# Patient Record
Sex: Female | Born: 1940 | Race: White | Hispanic: No | Smoking: Never smoker
Health system: Southern US, Community
[De-identification: ages and names within clinical notes are randomized; demographics above are authoritative.]

## PROBLEM LIST (undated history)

## (undated) DIAGNOSIS — F419 Anxiety disorder, unspecified: Secondary | ICD-10-CM

## (undated) DIAGNOSIS — E039 Hypothyroidism, unspecified: Secondary | ICD-10-CM

## (undated) DIAGNOSIS — R002 Palpitations: Secondary | ICD-10-CM

## (undated) HISTORY — PX: BACK SURGERY: SHX140

## (undated) HISTORY — PX: HYSTERECTOMY ABDOMINAL WITH SALPINGECTOMY: SHX6725

---

## 2018-09-15 ENCOUNTER — Other Ambulatory Visit: Payer: Self-pay

## 2018-09-15 ENCOUNTER — Encounter: Payer: Self-pay | Admitting: *Deleted

## 2018-09-15 DIAGNOSIS — Z79899 Other long term (current) drug therapy: Secondary | ICD-10-CM | POA: Insufficient documentation

## 2018-09-15 DIAGNOSIS — F419 Anxiety disorder, unspecified: Secondary | ICD-10-CM | POA: Insufficient documentation

## 2018-09-15 DIAGNOSIS — E039 Hypothyroidism, unspecified: Secondary | ICD-10-CM | POA: Insufficient documentation

## 2018-09-15 DIAGNOSIS — R002 Palpitations: Secondary | ICD-10-CM | POA: Diagnosis present

## 2018-09-15 DIAGNOSIS — F329 Major depressive disorder, single episode, unspecified: Secondary | ICD-10-CM | POA: Diagnosis not present

## 2018-09-15 DIAGNOSIS — I10 Essential (primary) hypertension: Secondary | ICD-10-CM | POA: Diagnosis not present

## 2018-09-15 DIAGNOSIS — Z79890 Hormone replacement therapy (postmenopausal): Secondary | ICD-10-CM | POA: Insufficient documentation

## 2018-09-15 DIAGNOSIS — K219 Gastro-esophageal reflux disease without esophagitis: Secondary | ICD-10-CM | POA: Insufficient documentation

## 2018-09-15 DIAGNOSIS — R0789 Other chest pain: Secondary | ICD-10-CM | POA: Diagnosis not present

## 2018-09-15 DIAGNOSIS — Z1159 Encounter for screening for other viral diseases: Secondary | ICD-10-CM | POA: Insufficient documentation

## 2018-09-15 DIAGNOSIS — R079 Chest pain, unspecified: Secondary | ICD-10-CM | POA: Diagnosis present

## 2018-09-15 LAB — BASIC METABOLIC PANEL
Anion gap: 8 (ref 5–15)
BUN: 18 mg/dL (ref 8–23)
CO2: 25 mmol/L (ref 22–32)
Calcium: 9.6 mg/dL (ref 8.9–10.3)
Chloride: 103 mmol/L (ref 98–111)
Creatinine, Ser: 0.93 mg/dL (ref 0.44–1.00)
GFR calc Af Amer: >60 mL/min (ref >60)
GFR calc non Af Amer: 59 mL/min — ABNORMAL LOW (ref >60)
Glucose, Bld: 92 mg/dL (ref 70–99)
Potassium: 4.3 mmol/L (ref 3.5–5.1)
Sodium: 136 mmol/L (ref 135–145)

## 2018-09-15 LAB — CBC
HCT: 37.3 % (ref 36.0–46.0)
Hemoglobin: 12.3 g/dL (ref 12.0–15.0)
MCH: 29.5 pg (ref 26.0–34.0)
MCHC: 33 g/dL (ref 30.0–36.0)
MCV: 89.4 fL (ref 80.0–100.0)
Platelets: 194 10*3/uL (ref 150–400)
RBC: 4.17 MIL/uL (ref 3.87–5.11)
RDW: 12.7 % (ref 11.5–15.5)
WBC: 7.3 10*3/uL (ref 4.0–10.5)
nRBC: 0 % (ref 0.0–0.2)

## 2018-09-15 LAB — TROPONIN I (HIGH SENSITIVITY): Troponin I (High Sensitivity): 5 ng/L (ref <18)

## 2018-09-15 MED ORDER — SODIUM CHLORIDE 0.9% FLUSH
3.0000 mL | Freq: Once | INTRAVENOUS | Status: AC
  Administered 2018-09-16: 3 mL via INTRAVENOUS

## 2018-09-15 NOTE — ED Triage Notes (Signed)
Pt to triage via wheelchair.  Pt has palpitations.  Pt reports chest pain with left arm numbness.  No sob.  Nonsmoker.  No cough  Pt alert.  Speech clear.

## 2018-09-16 ENCOUNTER — Emergency Department: Payer: Medicare HMO

## 2018-09-16 ENCOUNTER — Observation Stay
Admission: EM | Admit: 2018-09-16 | Discharge: 2018-09-16 | Disposition: A | Discharge status: Home / Self Care | Payer: Medicare HMO | Attending: Internal Medicine | Admitting: Internal Medicine

## 2018-09-16 ENCOUNTER — Observation Stay
Admit: 2018-09-16 | Discharge: 2018-09-16 | Disposition: A | Discharge status: Home / Self Care | Payer: Medicare HMO | Attending: Internal Medicine | Admitting: Internal Medicine

## 2018-09-16 ENCOUNTER — Other Ambulatory Visit: Payer: Self-pay

## 2018-09-16 ENCOUNTER — Encounter: Payer: Self-pay | Admitting: Internal Medicine

## 2018-09-16 DIAGNOSIS — R002 Palpitations: Secondary | ICD-10-CM | POA: Diagnosis not present

## 2018-09-16 DIAGNOSIS — I340 Nonrheumatic mitral (valve) insufficiency: Secondary | ICD-10-CM

## 2018-09-16 DIAGNOSIS — I491 Atrial premature depolarization: Secondary | ICD-10-CM

## 2018-09-16 DIAGNOSIS — F17200 Nicotine dependence, unspecified, uncomplicated: Secondary | ICD-10-CM

## 2018-09-16 DIAGNOSIS — R0602 Shortness of breath: Secondary | ICD-10-CM

## 2018-09-16 DIAGNOSIS — I361 Nonrheumatic tricuspid (valve) insufficiency: Secondary | ICD-10-CM

## 2018-09-16 DIAGNOSIS — R079 Chest pain, unspecified: Secondary | ICD-10-CM

## 2018-09-16 HISTORY — DX: Palpitations: R00.2

## 2018-09-16 HISTORY — DX: Hypothyroidism, unspecified: E03.9

## 2018-09-16 HISTORY — DX: Anxiety disorder, unspecified: F41.9

## 2018-09-16 LAB — SARS CORONAVIRUS 2 BY RT PCR (HOSPITAL ORDER, PERFORMED IN ~~LOC~~ HOSPITAL LAB): SARS Coronavirus 2: NEGATIVE

## 2018-09-16 LAB — TROPONIN I (HIGH SENSITIVITY): Troponin I (High Sensitivity): 6 ng/L (ref <18)

## 2018-09-16 LAB — ECHOCARDIOGRAM COMPLETE
Height: 65 in
Weight: 2465.6 oz

## 2018-09-16 LAB — TSH: TSH: 2.707 u[IU]/mL (ref 0.350–4.500)

## 2018-09-16 MED ORDER — ONDANSETRON HCL 4 MG/2ML IJ SOLN: 4.0000 mg | Freq: Four times a day (QID) | INTRAMUSCULAR | Status: DC | PRN

## 2018-09-16 MED ORDER — MIRTAZAPINE 15 MG PO TABS: 15.0000 mg | ORAL_TABLET | Freq: Every day | ORAL | Status: DC

## 2018-09-16 MED ORDER — NITROGLYCERIN 2 % TD OINT
0.5000 [in_us] | TOPICAL_OINTMENT | Freq: Once | TRANSDERMAL | Status: AC
  Administered 2018-09-16: 0.5 [in_us] via TOPICAL
  Filled 2018-09-16: qty 1

## 2018-09-16 MED ORDER — ADULT MULTIVITAMIN W/MINERALS CH
1.0000 | ORAL_TABLET | Freq: Every day | ORAL | Status: DC
  Administered 2018-09-16: 1 via ORAL
  Filled 2018-09-16: qty 1

## 2018-09-16 MED ORDER — VITAMIN C 500 MG PO TABS
1000.0000 mg | ORAL_TABLET | Freq: Every day | ORAL | Status: DC
  Administered 2018-09-16: 1000 mg via ORAL
  Filled 2018-09-16: qty 2

## 2018-09-16 MED ORDER — ESTRADIOL 1 MG PO TABS
0.5000 mg | ORAL_TABLET | Freq: Every day | ORAL | Status: DC
  Filled 2018-09-16: qty 0.5

## 2018-09-16 MED ORDER — BIOTIN 10000 MCG PO TABS: 1.0000 | ORAL_TABLET | Freq: Every day | ORAL | Status: DC

## 2018-09-16 MED ORDER — LINACLOTIDE 290 MCG PO CAPS
290.0000 ug | ORAL_CAPSULE | Freq: Every day | ORAL | Status: DC
  Administered 2018-09-16: 290 ug via ORAL
  Filled 2018-09-16: qty 1

## 2018-09-16 MED ORDER — COENZYME Q10 30 MG PO CAPS: 30.0000 mg | ORAL_CAPSULE | Freq: Every day | ORAL | Status: DC

## 2018-09-16 MED ORDER — HYDROXYZINE HCL 25 MG PO TABS
25.0000 mg | ORAL_TABLET | Freq: Three times a day (TID) | ORAL | Status: DC | PRN
  Administered 2018-09-16: 25 mg via ORAL
  Filled 2018-09-16: qty 1

## 2018-09-16 MED ORDER — ENOXAPARIN SODIUM 40 MG/0.4ML ~~LOC~~ SOLN
40.0000 mg | SUBCUTANEOUS | Status: DC
  Administered 2018-09-16: 40 mg via SUBCUTANEOUS
  Filled 2018-09-16: qty 0.4

## 2018-09-16 MED ORDER — LEVOTHYROXINE SODIUM 175 MCG PO TABS
175.0000 ug | ORAL_TABLET | Freq: Every day | ORAL | Status: DC
  Administered 2018-09-16: 175 ug via ORAL
  Filled 2018-09-16: qty 1

## 2018-09-16 MED ORDER — VITAMIN D 25 MCG (1000 UNIT) PO TABS
1000.0000 [IU] | ORAL_TABLET | Freq: Every day | ORAL | Status: DC
  Administered 2018-09-16: 1000 [IU] via ORAL
  Filled 2018-09-16: qty 1

## 2018-09-16 MED ORDER — ACETAMINOPHEN 650 MG RE SUPP: 650.0000 mg | Freq: Four times a day (QID) | RECTAL | Status: DC | PRN

## 2018-09-16 MED ORDER — BUPROPION HCL 100 MG PO TABS
100.0000 mg | ORAL_TABLET | Freq: Every day | ORAL | Status: DC
  Administered 2018-09-16: 100 mg via ORAL
  Filled 2018-09-16: qty 1

## 2018-09-16 MED ORDER — DOCUSATE SODIUM 100 MG PO CAPS
100.0000 mg | ORAL_CAPSULE | Freq: Two times a day (BID) | ORAL | Status: DC
  Administered 2018-09-16: 100 mg via ORAL
  Filled 2018-09-16: qty 1

## 2018-09-16 MED ORDER — PANTOPRAZOLE SODIUM 40 MG PO TBEC
40.0000 mg | DELAYED_RELEASE_TABLET | Freq: Every day | ORAL | Status: DC
  Administered 2018-09-16: 40 mg via ORAL
  Filled 2018-09-16: qty 1

## 2018-09-16 MED ORDER — ONDANSETRON HCL 4 MG PO TABS: 4.0000 mg | ORAL_TABLET | Freq: Four times a day (QID) | ORAL | Status: DC | PRN

## 2018-09-16 MED ORDER — ACETAMINOPHEN 325 MG PO TABS
650.0000 mg | ORAL_TABLET | Freq: Four times a day (QID) | ORAL | Status: DC | PRN
  Administered 2018-09-16: 650 mg via ORAL
  Filled 2018-09-16: qty 2

## 2018-09-16 NOTE — Consult Note (Signed)
Cardiology Consultation:   Patient ID: Ashley MantleJoyce Burget MRN: [id number]; DOB: 04-12-1940  Admit date: 09/16/2018 Date of Consult: 09/16/2018  Primary Care Provider: System, Pcp Not In Primary Cardiologist:[place] [facility] Reason for consult: Palpitations Physician requesting consult: Sheryle Hailiamond   Patient Profile:   Ashley Combs is a 78 y.o. female with a hx of smoking, COPD, anxiety who is being seen today for the evaluation of palpitations   History of Present Illness:   [place]ives in [place], [place] to stay with a friend Reports having some elevated blood pressure at home  Diastolic pressures into the 90s Also having some episodic palpitations difficult to quantify frequency or duration, Feels like a butterfly in the chest Denies any near-syncope or syncope  Also reports having some shortness of breath on exertion Able to walk 15 minutes then has to stop to catch her breath before she is able to resume her walking  She indicates that she had stress test 3 weeks ago, was told it looked normal Or a 1 week monitor while in [place], told that she had irregular heartbeat She was referred to EP, scheduled to see EP in the next several weeks    Heart Pathway Score:     Past Medical History:  Diagnosis Date  . Anxiety   . Hypothyroidism   . Palpitations     Past Surgical History:  Procedure Laterality Date  . BACK SURGERY    . HYSTERECTOMY ABDOMINAL WITH SALPINGECTOMY       Home Medications:  Prior to Admission medications   Medication Sig Start Date End Date Taking? Authorizing Provider  Ascorbic Acid (VITAMIN C) 1000 MG tablet Take 1,000 mg by mouth daily.   Yes [provider]  Biotin [id number] MCG TABS Take 1 tablet by mouth daily.   Yes [provider]  buPROPion (WELLBUTRIN) 100 MG tablet Take 100 mg by mouth daily.   Yes [provider]  cholecalciferol (VITAMIN D3) 25 MCG (1000 UT) tablet Take 1,000 Units by mouth daily.    Yes [provider]  co-enzyme Q-10 30 MG capsule Take 30 mg by mouth daily.   Yes [provider]  estradiol (ESTRACE) 0.5 MG tablet Take 0.5 mg by mouth daily.   Yes [provider]  hydrOXYzine (ATARAX/VISTARIL) 25 MG tablet Take 25 mg by mouth 3 (three) times daily as needed.   Yes [provider]  levothyroxine (SYNTHROID) 175 MCG tablet Take 175 mcg by mouth daily before breakfast.   Yes [provider]  linaclotide (LINZESS) 290 MCG CAPS capsule Take 290 mcg by mouth daily before breakfast.   Yes [provider]  mirtazapine (REMERON) 15 MG tablet Take 15 mg by mouth at bedtime.   Yes [provider]  Multiple Vitamin (MULTIVITAMIN WITH MINERALS) TABS tablet Take 1 tablet by mouth daily.   Yes [provider]  pantoprazole (PROTONIX) 40 MG tablet Take 40 mg by mouth daily.   Yes [provider]    Inpatient Medications: Scheduled Meds: . buPROPion  100 mg Oral Daily  . cholecalciferol  1,000 Units Oral Daily  . docusate sodium  100 mg Oral BID  . enoxaparin (LOVENOX) injection  40 mg Subcutaneous Q24H  . estradiol  0.5 mg Oral Daily  . levothyroxine  175 mcg Oral QAC breakfast  . linaclotide  290 mcg Oral QAC breakfast  . mirtazapine  15 mg Oral QHS  . multivitamin with minerals  1 tablet Oral Daily  .  pantoprazole  40 mg Oral Daily  . vitamin C  1,000 mg Oral Daily   Continuous Infusions:  PRN Meds: acetaminophen **OR** acetaminophen, hydrOXYzine, ondansetron **OR** ondansetron (ZOFRAN) IV  Allergies:    Allergies  Allergen Reactions  . Aspirin     Social History:   Social History   Socioeconomic History  . Marital status: Widowed    Spouse name: Not on file  . Number of children: Not on file  . Years of education: Not on file  . Highest education level: Not on file  Occupational History  . Not on file  Social Needs  . Financial resource strain: Not on file  . Food insecurity     Worry: Not on file    Inability: Not on file  . Transportation needs    Medical: Not on file    Non-medical: Not on file  Tobacco Use  . Smoking status: Never Smoker  . Smokeless tobacco: Never Used  Substance and Sexual Activity  . Alcohol use: Not Currently  . Drug use: Not Currently  . Sexual activity: Not on file  Lifestyle  . Physical activity    Days per week: Not on file    Minutes per session: Not on file  . Stress: Not on file  Relationships  . Social Musicianconnections    Talks on phone: Not on file    Gets together: Not on file    Attends religious service: Not on file    Active member of club or organization: Not on file    Attends meetings of clubs or organizations: Not on file    Relationship status: Not on file  . Intimate partner violence    Fear of current or ex partner: Not on file    Emotionally abused: Not on file    Physically abused: Not on file    Forced sexual activity: Not on file  Other Topics Concern  . Not on file  Social History Narrative  . Not on file    Family History:    Family History  Problem Relation Age of Onset  . Cancer Mother   . Cancer Father   . CAD Brother        CABG     ROS:  Please see the history of present illness.  Review of Systems  Constitutional: Negative.   Respiratory: Positive for shortness of breath.   Cardiovascular: Positive for palpitations.  Gastrointestinal: Negative.   Musculoskeletal: Negative.   Neurological: Negative.   Psychiatric/Behavioral: Negative.   All other systems reviewed and are negative.   Physical Exam/Data:   Vitals:   09/16/18 0538 09/16/18 0730 09/16/18 1150 09/16/18 1555  BP: (!) 155/99 113/83 134/82 (!) 154/95  Pulse: 61 (!) 48 (!) 55 64  Resp: 20     Temp: 97.9 F (36.6 C) 97.9 F (36.6 C)  98.4 F (36.9 C)  TempSrc: Oral Oral  Oral  SpO2: 100% 99%  98%  Weight: 69.9 kg     Height: 5\' 5"  (1.651 m)       Intake/Output Summary (Last 24 hours) at 09/16/2018 1820 Last  data filed at 09/16/2018 1350 Gross per 24 hour  Intake 483 ml  Output 1250 ml  Net -767 ml   Last 3 Weights 09/16/2018 09/16/2018  Weight (lbs) 154 lb 1.6 oz 157 lb  Weight (kg) 69.899 kg 71.215 kg     Body mass index is 25.64 kg/m.  General:  Well nourished, well developed, in no acute distress HEENT:  normal Lymph: no adenopathy Neck: no JVD Endocrine:  No thryomegaly Vascular: No carotid bruits; FA pulses 2+ bilaterally without bruits  Cardiac:  normal S1, S2; RRR; no murmur  Lungs:  clear to auscultation bilaterally, no wheezing, rhonchi or rales  Abd: soft, nontender, no hepatomegaly  Ext: no edema Musculoskeletal:  No deformities, BUE and BLE strength normal and equal Skin: warm and dry  Neuro:  CNs 2-12 intact, no focal abnormalities noted Psych:  Normal affect   EKG:  The EKG was personally reviewed and demonstrates: Normal sinus rhythm rate 63 bpm no significant ST or T wave changes, PACs noted Telemetry:  Telemetry was personally reviewed and demonstrates: Normal sinus rhythm, frequent PACs, 1 run of nonsustained VT 4 beats  Relevant CV Studies: Echocardiogram personally reviewed by myself showing normal LV function, normal RV function, mildly elevated right heart pressures estimated 35 mmHg, no significant valve disease, no wall motion abnormality  Laboratory Data:  High Sensitivity Troponin:   Recent Labs  Lab 09/15/18 2218 09/16/18 0131  TROPONINIHS 5 6     Cardiac EnzymesNo results for input(s): TROPONINI in the last 168 hours. No results for input(s): TROPIPOC in the last 168 hours.  Chemistry Recent Labs  Lab 09/15/18 2218  NA 136  K 4.3  CL 103  CO2 25  GLUCOSE 92  BUN 18  CREATININE 0.93  CALCIUM 9.6  GFRNONAA 59*  GFRAA >60  ANIONGAP 8    No results for input(s): PROT, ALBUMIN, AST, ALT, ALKPHOS, BILITOT in the last 168 hours. Hematology Recent Labs  Lab 09/15/18 2218  WBC 7.3  RBC 4.17  HGB 12.3  HCT 37.3  MCV 89.4  MCH 29.5  MCHC  33.0  RDW 12.7  PLT 194   BNPNo results for input(s): BNP, PROBNP in the last 168 hours.  DDimer No results for input(s): DDIMER in the last 168 hours.   Radiology/Studies:  Dg Chest 2 View  Result Date: 09/16/2018 CLINICAL DATA:  Palpitations. EXAM: CHEST - 2 VIEW COMPARISON:  None. FINDINGS: The cardiomediastinal contours are normal. Mild aortic tortuosity the lungs are clear. Pulmonary vasculature is normal. No consolidation, pleural effusion, or pneumothorax. No acute osseous abnormalities are seen. IMPRESSION: No acute chest findings. Electronically Signed   By: Keith Rake M.D.   On: 09/16/2018 01:28    Assessment and Plan:   1. Palpitations/PACs Also with one short run nonsustained VT 4 beats Suspect her palpitations symptoms are coming from the PACs Does not seem to happen every day, possibly exacerbated by anxiety -Recommend low-dose beta-blocker with slow titration upwards Discussed with hospitalist service,, suggest she start metoprolol succinate 12.5 mg daily If she continues to have symptoms would increase the dose up to 25 mg daily -For breakthrough episodes suggested she take propranolol 10 mg as needed, 3 times daily PRN.  This dose could also be increased up to 20 mg as needed if needed  2 hypertension Numbers this admission ranging up to 595 systolic, diastolics in the 63O Start metoprolol as above, propranolol as needed If needed could increase metoprolol for both blood pressure and rhythm control Other options include starting alternate medication for blood pressure such as losartan 25 mg daily Suggested she call our office over the next 2 weeks with blood pressure and heart rate numbers  3.  Shortness of breath Prior smoking history/COPD Also deconditioning Reports that she sees pulmonary in [place] Stable -Reports having recent stress test 3 weeks ago No further ischemic work-up needed at this time  Essentially normal echocardiogram this admission  4.   Atypical chest pain Normal echocardiogram, Stress test 3 weeks ago, no further ischemic work-up at this time  5.  Hypothyroidism On Synthroid Reports that TSH was elevated on generic thyroid medication, changed to brand name Synthroid    Total encounter time more than 110 minutes  Greater than 50% was spent in counseling and coordination of care with the patient   For questions or updates, please contact [facility] Please consult [email or web address] for contact info under     Signed, Julien Nordmannimothy Gollan, MD  09/16/2018 6:20 PM darting people on blood pressure pills he know to me Dr. Nancy MarusMayo

## 2018-09-16 NOTE — Discharge Instructions (Signed)
Palpitations Palpitations are feelings that your heartbeat is not normal. Your heartbeat may feel like it is:  Uneven.  Faster than normal.  Fluttering.  Skipping a beat. This is usually not a serious problem. In some cases, you may need tests to rule out any serious problems. Follow these instructions at home: Pay attention to any changes in your condition. Take these actions to help manage your symptoms: Eating and drinking  Avoid: ? Coffee, tea, soft drinks, and energy drinks. ? Chocolate. ? Alcohol. ? Diet pills. Lifestyle   Try to lower your stress. These things can help you relax: ? Yoga. ? Deep breathing and meditation. ? Exercise. ? Using words and images to create positive thoughts (guided imagery). ? Using your mind to control things in your body (biofeedback).  Do not use drugs.  Get plenty of rest and sleep. Keep a regular bed time. General instructions   Take over-the-counter and prescription medicines only as told by your doctor.  Do not use any products that contain nicotine or tobacco, such as cigarettes and e-cigarettes. If you need help quitting, ask your doctor.  Keep all follow-up visits as told by your doctor. This is important. You may need more tests if palpitations do not go away or get worse. Contact a doctor if:  Your symptoms last more than 24 hours.  Your symptoms occur more often. Get help right away if you:  Have chest pain.  Feel short of breath.  Have a very bad headache.  Feel dizzy.  Pass out (faint). Summary  Palpitations are feelings that your heartbeat is uneven or faster than normal. It may feel like your heart is fluttering or skipping a beat.  Avoid food and drinks that may cause palpitations. These include caffeine, chocolate, and alcohol.  Try to lower your stress. Do not smoke or use drugs.  Get help right away if you faint or have chest pain, shortness of breath, a severe headache, or dizziness. This  information is not intended to replace advice given to you by your health care provider. Make sure you discuss any questions you have with your health care provider. Document Released: 12/04/2007 Document Revised: 04/08/2017 Document Reviewed: 04/08/2017 Elsevier Patient Education  2020 [employer]  

## 2018-09-16 NOTE — Plan of Care (Signed)

## 2018-09-16 NOTE — H&P (Signed)
Ashley Combs is an 78 y.o. female.   Chief Complaint: Palpitations HPI: The patient with past medical history of hypothyroidism as well as palpitations presents to the emergency department complaining of racing heartbeat.  She admits to feeling pressure in her chest this evening after dinner.  She checked her blood pressure and reports that it was higher than usual.  At that time she also began to feel palpitations as well as some numbness in her left arm.  She denies nausea, vomiting or diaphoresis.  Initial high-sensitivity troponins in the emergency department were negative.  However the patient continued to have ectopy on telemetry.  Her blood pressure was also found to be greater than 170/80.  Nitropaste is applied to her chest in the emergency department staff called the hospitalist service for admission.  Past Medical History:  Diagnosis Date  . Anxiety   . Hypothyroidism   . Palpitations     Past Surgical History:  Procedure Laterality Date  . BACK SURGERY    . HYSTERECTOMY ABDOMINAL WITH SALPINGECTOMY      Family History  Problem Relation Age of Onset  . Cancer Mother   . Cancer Father   . CAD Brother        CABG   Social History:  reports that she has never smoked. She has never used smokeless tobacco. She reports previous alcohol use. She reports previous drug use.  Allergies:  Allergies  Allergen Reactions  . Aspirin     Medications Prior to Admission  Medication Sig Dispense Refill  . Ascorbic Acid (VITAMIN C) 1000 MG tablet Take 1,000 mg by mouth daily.    . Biotin 10000 MCG TABS Take 1 tablet by mouth daily.    Marland Kitchen buPROPion (WELLBUTRIN) 100 MG tablet Take 100 mg by mouth daily.    . cholecalciferol (VITAMIN D3) 25 MCG (1000 UT) tablet Take 1,000 Units by mouth daily.    Marland Kitchen co-enzyme Q-10 30 MG capsule Take 30 mg by mouth daily.    Marland Kitchen estradiol (ESTRACE) 0.5 MG tablet Take 0.5 mg by mouth daily.    . hydrOXYzine (ATARAX/VISTARIL) 25 MG tablet Take 25 mg by mouth 3  (three) times daily as needed.    Marland Kitchen levothyroxine (SYNTHROID) 175 MCG tablet Take 175 mcg by mouth daily before breakfast.    . linaclotide (LINZESS) 290 MCG CAPS capsule Take 290 mcg by mouth daily before breakfast.    . mirtazapine (REMERON) 15 MG tablet Take 15 mg by mouth at bedtime.    . Multiple Vitamin (MULTIVITAMIN WITH MINERALS) TABS tablet Take 1 tablet by mouth daily.    . pantoprazole (PROTONIX) 40 MG tablet Take 40 mg by mouth daily.      Results for orders placed or performed during the hospital encounter of 09/16/18 (from the past 48 hour(s))  Basic metabolic panel     Status: Abnormal   Collection Time: 09/15/18 10:18 PM  Result Value Ref Range   Sodium 136 135 - 145 mmol/L   Potassium 4.3 3.5 - 5.1 mmol/L   Chloride 103 98 - 111 mmol/L   CO2 25 22 - 32 mmol/L   Glucose, Bld 92 70 - 99 mg/dL   BUN 18 8 - 23 mg/dL   Creatinine, Ser 0.93 0.44 - 1.00 mg/dL   Calcium 9.6 8.9 - 10.3 mg/dL   GFR calc non Af Amer 59 (L) >60 mL/min   GFR calc Af Amer >60 >60 mL/min   Anion gap 8 5 - 15  Comment: Performed at [facility], [address] [place], [place] [address]  CBC     Status: None   Collection Time: 09/15/18 10:18 PM  Result Value Ref Range   WBC 7.3 4.0 - 10.5 K/uL   RBC 4.17 3.87 - 5.11 MIL/uL   Hemoglobin 12.3 12.0 - 15.0 g/dL   HCT 19.137.3 47.836.0 - 29.546.0 %   MCV 89.4 80.0 - 100.0 fL   MCH 29.5 26.0 - 34.0 pg   MCHC 33.0 30.0 - 36.0 g/dL   RDW 62.112.7 30.811.5 - 65.715.5 %   Platelets 194 150 - 400 K/uL   nRBC 0.0 0.0 - 0.2 %    Comment: Performed at [facility], [address] [place], [place] [address]  Troponin I (High Sensitivity)     Status: None   Collection Time: 09/15/18 10:18 PM  Result Value Ref Range   Troponin I (High Sensitivity) 5 <18 ng/L    Comment: (NOTE) Elevated high sensitivity troponin I (hsTnI) values and significant  changes across serial measurements may suggest ACS but many other  chronic and acute conditions are known to  elevate hsTnI results.  Refer to the "Links" section for chest pain algorithms and additional  guidance. Performed at [facility], [address] [place], [place] [address]   Troponin I (High Sensitivity)     Status: None   Collection Time: 09/16/18  1:31 AM  Result Value Ref Range   Troponin I (High Sensitivity) 6 <18 ng/L    Comment: (NOTE) Elevated high sensitivity troponin I (hsTnI) values and significant  changes across serial measurements may suggest ACS but many other  chronic and acute conditions are known to elevate hsTnI results.  Refer to the "Links" section for chest pain algorithms and additional  guidance. Performed at [facility], [address] [place], [place] [address]   SARS Coronavirus 2 (CEPHEID - Performed in [facility] hospital lab), Hosp Order     Status: None   Collection Time: 09/16/18  1:31 AM   Specimen: Nasopharyngeal Swab  Result Value Ref Range   SARS Coronavirus 2 NEGATIVE NEGATIVE    Comment: (NOTE) If result is NEGATIVE SARS-CoV-2 target nucleic acids are NOT DETECTED. The SARS-CoV-2 RNA is generally detectable in upper and lower  respiratory specimens during the acute phase of infection. The lowest  concentration of SARS-CoV-2 viral copies this assay can detect is 250  copies / mL. A negative result does not preclude SARS-CoV-2 infection  and should not be used as the sole basis for treatment or other  patient management decisions.  A negative result may occur with  improper specimen collection / handling, submission of specimen other  than nasopharyngeal swab, presence of viral mutation(s) within the  areas targeted by this assay, and inadequate number of viral copies  (<250 copies / mL). A negative result must be combined with clinical  observations, patient history, and epidemiological information. If result is POSITIVE SARS-CoV-2 target nucleic acids are DETECTED. The SARS-CoV-2 RNA is generally detectable in  upper and lower  respiratory specimens dur ing the acute phase of infection.  Positive  results are indicative of active infection with SARS-CoV-2.  Clinical  correlation with patient history and other diagnostic information is  necessary to determine patient infection status.  Positive results do  not rule out bacterial infection or co-infection with other viruses. If result is PRESUMPTIVE POSTIVE SARS-CoV-2 nucleic acids MAY BE PRESENT.   A presumptive positive result was obtained on  the submitted specimen  and confirmed on repeat testing.  While 2019 novel coronavirus  (SARS-CoV-2) nucleic acids may be present in the submitted sample  additional confirmatory testing may be necessary for epidemiological  and / or clinical management purposes  to differentiate between  SARS-CoV-2 and other Sarbecovirus currently known to infect humans.  If clinically indicated additional testing with an alternate test  methodology [id number]) is advised. The SARS-CoV-2 RNA is generally  detectable in upper and lower respiratory sp ecimens during the acute  phase of infection. The expected result is Negative. Fact Sheet for Patients:  BoilerBrush.com.cyhttps://www.fda.gov/media/136312/download Fact Sheet for Healthcare Providers: https://pope.com/https://www.fda.gov/media/136313/download This test is not yet approved or cleared by the [place] FDA and has been authorized for detection and/or diagnosis of SARS-CoV-2 by FDA under an Emergency Use Authorization (EUA).  This EUA will remain in effect (meaning this test can be used) for the duration of the COVID-19 declaration under Section 564(b)(1) of the Act, 21 U.S.C. section 360bbb-3(b)(1), unless the authorization is terminated or revoked sooner. Performed at [facility], [address] [place], [place] [address]    Dg Chest 2 View  Result Date: 09/16/2018 CLINICAL DATA:  Palpitations. EXAM: CHEST - 2 VIEW COMPARISON:  None. FINDINGS: The cardiomediastinal contours  are normal. Mild aortic tortuosity the lungs are clear. Pulmonary vasculature is normal. No consolidation, pleural effusion, or pneumothorax. No acute osseous abnormalities are seen. IMPRESSION: No acute chest findings. Electronically Signed   By: Narda RutherfordMelanie  Sanford M.D.   On: 09/16/2018 01:28    Review of Systems  Constitutional: Negative for chills and fever.  HENT: Negative for sore throat and tinnitus.   Eyes: Negative for blurred vision and redness.  Respiratory: Positive for shortness of breath. Negative for cough.   Cardiovascular: Positive for chest pain and palpitations. Negative for orthopnea and PND.  Gastrointestinal: Negative for abdominal pain, diarrhea, nausea and vomiting.  Genitourinary: Negative for dysuria, frequency and urgency.  Musculoskeletal: Negative for joint pain and myalgias.  Skin: Negative for rash.       No lesions  Neurological: Negative for speech change, focal weakness and weakness.  Endo/Heme/Allergies: Does not bruise/bleed easily.       No temperature intolerance  Psychiatric/Behavioral: Negative for depression and suicidal ideas.    Blood pressure (!) 155/99, pulse 61, temperature 97.9 F (36.6 C), temperature source Oral, resp. rate 20, height 5\' 5"  (1.651 m), weight 69.9 kg, SpO2 100 %. Physical Exam  Vitals reviewed. Constitutional: She is oriented to person, place, and time. She appears well-developed and well-nourished. No distress.  HENT:  Head: Normocephalic and atraumatic.  Mouth/Throat: Oropharynx is clear and moist.  Eyes: Pupils are equal, round, and reactive to light. Conjunctivae and EOM are normal. No scleral icterus.  Neck: Normal range of motion. Neck supple. No JVD present. No tracheal deviation present. No thyromegaly present.  Cardiovascular: Normal rate, regular rhythm and normal heart sounds. Exam reveals no gallop and no friction rub.  No murmur heard. Respiratory: Effort normal and breath sounds normal.  GI: Soft. Bowel  sounds are normal. She exhibits no distension. There is no abdominal tenderness.  Genitourinary:    Genitourinary Comments: Deferred   Musculoskeletal: Normal range of motion.        General: No edema.  Lymphadenopathy:    She has no cervical adenopathy.  Neurological: She is alert and oriented to person, place, and time. No cranial nerve deficit. She exhibits normal muscle tone.  Skin: Skin is warm and dry. No rash  noted. No erythema.  Psychiatric: She has a normal mood and affect. Her behavior is normal. Judgment and thought content normal.     Assessment/Plan This is a 78 year old female admitted for palpitations. 1.  Palpitations: Previous Holter monitor evaluation in [place] did not show significant arrhythmia.  The patient reports palpitations more frequently when she takes a deep breath following exercise.  She may have some underlying atrial enlargement leading to paroxysmal atrial fibrillation.  Consult cardiology for further guidance. 2.  Atypical chest pain: No evidence of myocardial ischemia.  Pain may be associated with anxiety/tachypnea/palpitations that may exacerbate each other.  The patient is chest pain-free at this time.  She may also have symptomatic hypertension that exacerbates the above symptoms.  Continue to monitor telemetry. 3.  Hypertension: Uncontrolled; improved following Nitropaste.  Heart rate is low.  Consider calcium channel blocker if needed. 4.  Hypothyroidism: Check TSH; continue Synthroid 5.  DVT prophylaxis: Lovenox 6.  GI prophylaxis: Pantoprazole per home regimen The patient is a full code.  Time spent on admission orders and patient care approximately 45 minutes  Arnaldo Nataliamond,  Frederica Chrestman S, MD 09/16/2018, 7:28 AM

## 2018-09-16 NOTE — Plan of Care (Signed)
  Problem: Clinical Measurements: Goal: Respiratory complications will improve Outcome: Progressing Note: On room air   Problem: Activity: Goal: Risk for activity intolerance will decrease Outcome: Progressing Note: Up independently in room steady on feet   Problem: Nutrition: Goal: Adequate nutrition will be maintained Outcome: Progressing   Problem: Elimination: Goal: Will not experience complications related to urinary retention Outcome: Progressing   Problem: Pain Managment: Goal: General experience of comfort will improve Outcome: Progressing Note: No complaints of pain since coming up from the ED, nitro still in place on chest    Problem: Safety: Goal: Ability to remain free from injury will improve Outcome: Progressing   Problem: Skin Integrity: Goal: Risk for impaired skin integrity will decrease Outcome: Progressing   Problem: Education: Goal: Knowledge of General Education information will improve Description: Including pain rating scale, medication(s)/side effects and non-pharmacologic comfort measures Outcome: Completed/Met   Problem: Coping: Goal: Level of anxiety will decrease Outcome: Completed/Met

## 2018-09-16 NOTE — Plan of Care (Signed)
  Problem: Health Behavior/Discharge Planning: Goal: Ability to manage health-related needs will improve 09/16/2018 1837 by Aubery Lapping, RN Outcome: Completed/Met 09/16/2018 1420 by Aubery Lapping, RN Outcome: Progressing   Problem: Clinical Measurements: Goal: Ability to maintain clinical measurements within normal limits will improve 09/16/2018 1837 by Aubery Lapping, RN Outcome: Completed/Met 09/16/2018 1420 by Aubery Lapping, RN Outcome: Progressing Goal: Will remain free from infection 09/16/2018 1837 by Aubery Lapping, RN Outcome: Completed/Met 09/16/2018 1420 by Aubery Lapping, RN Outcome: Progressing Goal: Diagnostic test results will improve 09/16/2018 1837 by Aubery Lapping, RN Outcome: Completed/Met 09/16/2018 1420 by Aubery Lapping, RN Outcome: Progressing Goal: Respiratory complications will improve 09/16/2018 1837 by Aubery Lapping, RN Outcome: Completed/Met 09/16/2018 1420 by Aubery Lapping, RN Outcome: Progressing Goal: Cardiovascular complication will be avoided 09/16/2018 1837 by Aubery Lapping, RN Outcome: Completed/Met 09/16/2018 1420 by Aubery Lapping, RN Outcome: Progressing   Problem: Activity: Goal: Risk for activity intolerance will decrease 09/16/2018 1837 by Aubery Lapping, RN Outcome: Completed/Met 09/16/2018 1420 by Aubery Lapping, RN Outcome: Progressing   Problem: Nutrition: Goal: Adequate nutrition will be maintained 09/16/2018 1837 by Aubery Lapping, RN Outcome: Completed/Met 09/16/2018 1420 by Aubery Lapping, RN Outcome: Progressing   Problem: Elimination: Goal: Will not experience complications related to bowel motility 09/16/2018 1837 by Aubery Lapping, RN Outcome: Completed/Met 09/16/2018 1420 by Aubery Lapping, RN Outcome: Progressing Goal: Will not experience complications related to urinary retention 09/16/2018 1837 by Aubery Lapping, RN Outcome:  Completed/Met 09/16/2018 1420 by Aubery Lapping, RN Outcome: Progressing   Problem: Pain Managment: Goal: General experience of comfort will improve 09/16/2018 1837 by Aubery Lapping, RN Outcome: Completed/Met 09/16/2018 1420 by Aubery Lapping, RN Outcome: Progressing   Problem: Safety: Goal: Ability to remain free from injury will improve 09/16/2018 1837 by Aubery Lapping, RN Outcome: Completed/Met 09/16/2018 1420 by Aubery Lapping, RN Outcome: Progressing   Problem: Skin Integrity: Goal: Risk for impaired skin integrity will decrease 09/16/2018 1837 by Aubery Lapping, RN Outcome: Completed/Met 09/16/2018 1420 by Aubery Lapping, RN Outcome: Progressing

## 2018-09-16 NOTE — ED Provider Notes (Signed)
[facility] Emergency Department Provider Note   ____________________________________________   First MD Initiated Contact with Patient 09/16/18 0113     (approximate)  I have reviewed the triage vital signs and the nursing notes.   HISTORY  Chief Complaint Palpitations    HPI Ashley Combs is a 78 y.o. female who presents to the ED with a chief complaint of palpitations, chest discomfort with left arm numbness.  Patient is here visiting her sister from [place].  States she had a normal stress test approximately 1 month ago and wore a Holter monitor.   Reports feeling palpitations approximately 6 PM where her heart was beating irregularly.  Has noted increasing blood pressure.  States her baseline is 130s/70s.  Does not take anything for blood pressure at baseline.  Denies fever, cough, shortness of breath, abdominal pain, nausea, vomiting, diaphoresis or dizziness.  Denies recent trauma or exposure to persons diagnosed with coronavirus.    Past medical history GERD Hypothyroidism  There are no active problems to display for this patient.   Prior to Admission medications   Not on File    Allergies Aspirin  No family history on file.  Social History Social History   Tobacco Use  . Smoking status: Never Smoker  . Smokeless tobacco: Never Used  Substance Use Topics  . Alcohol use: Not Currently  . Drug use: Not Currently    Review of Systems  Constitutional: No fever/chills Eyes: No visual changes. ENT: No sore throat. Cardiovascular: Positive for palpitations and chest pain. Respiratory: Denies shortness of breath. Gastrointestinal: No abdominal pain.  No nausea, no vomiting.  No diarrhea.  No constipation. Genitourinary: Negative for dysuria. Musculoskeletal: Negative for back pain. Skin: Negative for rash. Neurological: Negative for headaches, focal weakness or numbness.   ____________________________________________   PHYSICAL  EXAM:  VITAL SIGNS: ED Triage Vitals  Enc Vitals Group     BP 09/15/18 2216 (!) 155/70     Pulse Rate 09/15/18 2216 (!) 49     Resp 09/15/18 2216 20     Temp 09/15/18 2216 98.7 F (37.1 C)     Temp Source 09/15/18 2216 Oral     SpO2 09/15/18 2216 97 %     Weight --      Height --      Head Circumference --      Peak Flow --      Pain Score 09/15/18 2213 5     Pain Loc --      Pain Edu? --      Excl. in GC? --     Constitutional: Alert and oriented. Well appearing and in no acute distress. Eyes: Conjunctivae are normal. PERRL. EOMI. Head: Atraumatic. Nose: No congestion/rhinnorhea. Mouth/Throat: Mucous membranes are moist.  Oropharynx non-erythematous. Neck: No stridor.   Cardiovascular: Normal rate, regular rhythm. Grossly normal heart sounds.  Good peripheral circulation. Respiratory: Normal respiratory effort.  No retractions. Lungs CTAB. Gastrointestinal: Soft and nontender. No distention. No abdominal bruits. No CVA tenderness. Musculoskeletal: No lower extremity tenderness nor edema.  No joint effusions. Neurologic:  Normal speech and language. No gross focal neurologic deficits are appreciated. No gait instability. Skin:  Skin is warm, dry and intact. No rash noted. Psychiatric: Mood and affect are normal. Speech and behavior are normal.  ____________________________________________   LABS (all labs ordered are listed, but only abnormal results are displayed)  Labs Reviewed  BASIC METABOLIC PANEL - Abnormal; Notable for the following components:      Result  Value   GFR calc non Af Amer 59 (*)    All other components within normal limits  SARS CORONAVIRUS 2 (HOSPITAL ORDER, [facility])  CBC  TROPONIN I (HIGH SENSITIVITY)  TROPONIN I (HIGH SENSITIVITY)   ____________________________________________  EKG  ED ECG REPORT I, [place] Ha[place] J, the attending physician, personally viewed and interpreted this ECG.   Date: 09/16/2018  EKG  Time: 2215  Rate: 63  Rhythm: normal EKG, normal sinus rhythm  Axis: LAD  Intervals:none  ST&T Change: Nonspecific  ____________________________________________  RADIOLOGY  ED MD interpretation: No acute cardiopulmonary process  Official radiology report(s): Dg Chest 2 View  Result Date: 09/16/2018 CLINICAL DATA:  Palpitations. EXAM: CHEST - 2 VIEW COMPARISON:  None. FINDINGS: The cardiomediastinal contours are normal. Mild aortic tortuosity the lungs are clear. Pulmonary vasculature is normal. No consolidation, pleural effusion, or pneumothorax. No acute osseous abnormalities are seen. IMPRESSION: No acute chest findings. Electronically Signed   By: Keith Rake M.D.   On: 09/16/2018 01:28    ____________________________________________   PROCEDURES  Procedure(s) performed (including Critical Care):  Procedures   ____________________________________________   INITIAL IMPRESSION / ASSESSMENT AND PLAN / ED COURSE  As part of my medical decision making, I reviewed the following data within the [facility] notes reviewed and incorporated, Labs reviewed, EKG interpreted, Old chart reviewed, Radiograph reviewed, Discussed with admitting physician and Notes from prior ED visits     Ashley Combs was evaluated in Emergency Department on 09/16/2018 for the symptoms described in the history of present illness. She was evaluated in the context of the global COVID-19 pandemic, which necessitated consideration that the patient might be at risk for infection with the SARS-CoV-2 virus that causes COVID-19. Institutional protocols and algorithms that pertain to the evaluation of patients at risk for COVID-19 are in a state of rapid change based on information released by regulatory bodies including the CDC and federal and state organizations. These policies and algorithms were followed during the patient's care in the ED.   78 year old female who presents with  palpitations, chest pain with left arm numbness. Differential diagnosis includes, but is not limited to, ACS, aortic dissection, pulmonary embolism, cardiac tamponade, pneumothorax, pneumonia, pericarditis, myocarditis, GI-related causes including esophagitis/gastritis, and musculoskeletal chest wall pain.    Patient is allergic to aspirin; will apply nitroglycerin paste to help with chest pain as well as hypertension.  Rapid COVID swab as patient recently traveled from [place].  Anticipate hospitalization.  Clinical Course as of Sep 15 213  Thu Sep 16, 2018  0215 Discussed with hospitalist services who will evaluate patient in the ED for admission.   [JS]    Clinical Course User Index [JS] Paulette Blanch, MD     ____________________________________________   FINAL CLINICAL IMPRESSION(S) / ED DIAGNOSES  Final diagnoses:  Heart palpitations  Chest pain, unspecified type     ED Discharge Orders    None       Note:  This document was prepared using Dragon voice recognition software and may include unintentional dictation errors.   Paulette Blanch, MD 09/16/18 [phone]

## 2018-09-16 NOTE — Progress Notes (Signed)
PHARMACIST - PHYSICIAN ORDER COMMUNICATION  CONCERNING: P&T Medication Policy on Herbal Medications  DESCRIPTION:  This patient's orders for:  Biotin and Co-enzyme Q10  have been noted.  This product(s) is classified as an "herbal" or natural product. Due to a lack of definitive safety studies or FDA approval, nonstandard manufacturing practices, plus the potential risk of unknown drug-drug interactions while on inpatient medications, the Pharmacy and Therapeutics Committee does not permit the use of "herbal" or natural products of this type within [facility].   ACTION TAKEN: The pharmacy department is unable to verify this order at this time.  Please reevaluate patient's clinical condition at discharge and address if the herbal or natural product(s) should be resumed at that time.  Pernell Dupre, PharmD, BCPS Clinical Pharmacist 09/16/2018 5:37 AM

## 2018-09-16 NOTE — Progress Notes (Addendum)
[place] [facility] [place] NAME: Ashley Combs    MR#:  [id number]  DATE OF BIRTH:  08-Apr-1940  SUBJECTIVE:  CHIEF COMPLAINT:   Chief Complaint  Patient presents with  . Palpitations  Patient denies any further palpitations or chest pain. She reports that her left arm numbness has resolved, and today says she does not know if it was exactly numbness or a burning feeling.   REVIEW OF SYSTEMS:  Review of Systems  Respiratory: Negative for cough and shortness of breath.   Cardiovascular: Negative for chest pain, palpitations and leg swelling.    DRUG ALLERGIES:   Allergies  Allergen Reactions  . Aspirin    VITALS:  Blood pressure 134/82, pulse (!) 55, temperature 97.9 F (36.6 C), temperature source Oral, resp. rate 20, height 5\' 5"  (1.651 m), weight 69.9 kg, SpO2 99 %. PHYSICAL EXAMINATION:  Physical Exam Vitals signs reviewed.  Constitutional:      General: She is not in acute distress.    Appearance: Normal appearance.  HENT:     Head: Normocephalic and atraumatic.  Pulmonary:     Effort: Pulmonary effort is normal.  Musculoskeletal:     Right lower leg: No edema.     Left lower leg: No edema.  Skin:    General: Skin is warm.  Neurological:     General: No focal deficit present.     Mental Status: She is alert.  Psychiatric:        Mood and Affect: Mood normal.        Behavior: Behavior normal.    LABORATORY PANEL:  Female CBC Recent Labs  Lab 09/15/18 2218  WBC 7.3  HGB 12.3  HCT 37.3  PLT 194   ------------------------------------------------------------------------------------------------------------------ Chemistries  Recent Labs  Lab 09/15/18 2218  NA 136  K 4.3  CL 103  CO2 25  GLUCOSE 92  BUN 18  CREATININE 0.93  CALCIUM 9.6   RADIOLOGY:  Dg Chest 2 View  Result Date: 09/16/2018 CLINICAL DATA:  Palpitations. EXAM: CHEST - 2 VIEW COMPARISON:  None. FINDINGS: The cardiomediastinal contours are  normal. Mild aortic tortuosity the lungs are clear. Pulmonary vasculature is normal. No consolidation, pleural effusion, or pneumothorax. No acute osseous abnormalities are seen. IMPRESSION: No acute chest findings. Electronically Signed   By: Keith Rake M.D.   On: 09/16/2018 01:28   ASSESSMENT AND PLAN:  This is a 78 year old female with a past medical history of hypothyroidism, and HTN was admitted for palpitations.  1.  Palpitations- Previous Holter monitor evaluation in [place] did not show significant arrhythmia. States she has an appt with EP 10/04/2018   - Endorses worsening with exertion. - HR  48-70's o/n - cardiology consulted for further guidance  2.  Atypical chest pain, resolved: No evidence of myocardial ischemia.   - hs troponin 5>6 - monitor  3.  Hypertension: Uncontrolled- improved   - Consider ACE/ARB or calcium channel blocker if needed - f/u with PCP  4.  Hypothyroidism, TSH wnl - continue Synthroid - however patient is on biotin daily at home which may interfere with TSH levels, may need recheck after being off biotin if concerned thyroid may be contributing to palpitations  DVT prophylaxis: Lovenox GI prophylaxis: Pantoprazole per home regimen  All the records are reviewed and case discussed with Care Management/Social Worker. Management plans discussed with the patient, family and they are in agreement.  CODE STATUS: Full Code  POSSIBLE D/C TODAY, DEPENDING ON  CLINICAL CONDITION.  [place] Katricia Prehn, DO PGY-3, [facility]  09/16/2018 at 12:09 PM  Between 7am to 6pm - Pager - [phone]  After 6pm go to [email or web address] - Social research officer, governmentpassword EPAS [email or web address]  [facility]  Office  [phone]  CC: Primary care physician; System, Pcp Not In

## 2018-09-16 NOTE — ED Notes (Signed)
Patient denies chest pain, states she felt "palpitations" earlier this evening as well as some numbness in her L arm

## 2018-09-16 NOTE — ED Notes (Signed)
ED TO INPATIENT HANDOFF REPORT  ED Nurse Name and Phone #: Gwynneth MunsonButch, RN [phone]  S Name/Age/Gender Ashley Combs 78 y.o. female Room/Bed: ED13A/ED13A  Code Status   Code Status: Not on file  Home/SNF/Other Home Patient oriented to: self, place, time and situation Is this baseline? Yes   Triage Complete: Triage complete  Chief Complaint palpitations  Triage Note Pt to triage via wheelchair.  Pt has palpitations.  Pt reports chest pain with left arm numbness.  No sob.  Nonsmoker.  No cough  Pt alert.  Speech clear.    Allergies Allergies  Allergen Reactions  . Aspirin     Level of Care/Admitting Diagnosis ED Disposition    ED Disposition Condition Comment   Admit  The patient appears reasonably stabilized for admission considering the current resources, flow, and capabilities available in the ED at this time, and I doubt any other [facility] further screening and/or treatment in the ED prior to admission is  present.       B Medical/Surgery History No past medical history on file.    A IV Location/Drains/Wounds Patient Lines/Drains/Airways Status   Active Line/Drains/Airways    Name:   Placement date:   Placement time:   Site:   Days:   Peripheral IV 09/16/18 Left Antecubital   09/16/18    0135    Antecubital   less than 1          Intake/Output Last 24 hours No intake or output data in the 24 hours ending 09/16/18 0246  Labs/Imaging Results for orders placed or performed during the hospital encounter of 09/16/18 (from the past 48 hour(s))  Basic metabolic panel     Status: Abnormal   Collection Time: 09/15/18 10:18 PM  Result Value Ref Range   Sodium 136 135 - 145 mmol/L   Potassium 4.3 3.5 - 5.1 mmol/L   Chloride 103 98 - 111 mmol/L   CO2 25 22 - 32 mmol/L   Glucose, Bld 92 70 - 99 mg/dL   BUN 18 8 - 23 mg/dL   Creatinine, Ser 9.560.93 0.44 - 1.00 mg/dL   Calcium 9.6 8.9 - 21.310.3 mg/dL   GFR calc non Af Amer 59 (L) >60 mL/min   GFR calc Af Amer >60 >60  mL/min   Anion gap 8 5 - 15    Comment: Performed at [facility], [address] [place], [place] [address]  CBC     Status: None   Collection Time: 09/15/18 10:18 PM  Result Value Ref Range   WBC 7.3 4.0 - 10.5 K/uL   RBC 4.17 3.87 - 5.11 MIL/uL   Hemoglobin 12.3 12.0 - 15.0 g/dL   HCT 84.637.3 96.236.0 - 95.246.0 %   MCV 89.4 80.0 - 100.0 fL   MCH 29.5 26.0 - 34.0 pg   MCHC 33.0 30.0 - 36.0 g/dL   RDW 84.112.7 32.411.5 - 40.115.5 %   Platelets 194 150 - 400 K/uL   nRBC 0.0 0.0 - 0.2 %    Comment: Performed at [facility], [address] [place], [place] [address]  Troponin I (High Sensitivity)     Status: None   Collection Time: 09/15/18 10:18 PM  Result Value Ref Range   Troponin I (High Sensitivity) 5 <18 ng/L    Comment: (NOTE) Elevated high sensitivity troponin I (hsTnI) values and significant  changes across serial measurements may suggest ACS but many other  chronic and acute conditions are known to elevate hsTnI results.  Refer  to the "Links" section for chest pain algorithms and additional  guidance. Performed at [facility], [place], [place] [address]   Troponin I (High Sensitivity)     Status: None   Collection Time: 09/16/18  1:31 AM  Result Value Ref Range   Troponin I (High Sensitivity) 6 <18 ng/L    Comment: (NOTE) Elevated high sensitivity troponin I (hsTnI) values and significant  changes across serial measurements may suggest ACS but many other  chronic and acute conditions are known to elevate hsTnI results.  Refer to the "Links" section for chest pain algorithms and additional  guidance. Performed at [facility], [address] [place], [place] [address]   SARS Coronavirus 2 (CEPHEID - Performed in [facility] hospital lab), Hosp Order     Status: None   Collection Time: 09/16/18  1:31 AM   Specimen: Nasopharyngeal Swab  Result Value Ref Range   SARS Coronavirus 2 NEGATIVE NEGATIVE    Comment: (NOTE) If  result is NEGATIVE SARS-CoV-2 target nucleic acids are NOT DETECTED. The SARS-CoV-2 RNA is generally detectable in upper and lower  respiratory specimens during the acute phase of infection. The lowest  concentration of SARS-CoV-2 viral copies this assay can detect is 250  copies / mL. A negative result does not preclude SARS-CoV-2 infection  and should not be used as the sole basis for treatment or other  patient management decisions.  A negative result may occur with  improper specimen collection / handling, submission of specimen other  than nasopharyngeal swab, presence of viral mutation(s) within the  areas targeted by this assay, and inadequate number of viral copies  (<250 copies / mL). A negative result must be combined with clinical  observations, patient history, and epidemiological information. If result is POSITIVE SARS-CoV-2 target nucleic acids are DETECTED. The SARS-CoV-2 RNA is generally detectable in upper and lower  respiratory specimens dur ing the acute phase of infection.  Positive  results are indicative of active infection with SARS-CoV-2.  Clinical  correlation with patient history and other diagnostic information is  necessary to determine patient infection status.  Positive results do  not rule out bacterial infection or co-infection with other viruses. If result is PRESUMPTIVE POSTIVE SARS-CoV-2 nucleic acids MAY BE PRESENT.   A presumptive positive result was obtained on the submitted specimen  and confirmed on repeat testing.  While 2019 novel coronavirus  (SARS-CoV-2) nucleic acids may be present in the submitted sample  additional confirmatory testing may be necessary for epidemiological  and / or clinical management purposes  to differentiate between  SARS-CoV-2 and other Sarbecovirus currently known to infect humans.  If clinically indicated additional testing with an alternate test  methodology [phone]) is advised. The SARS-CoV-2 RNA is generally   detectable in upper and lower respiratory sp ecimens during the acute  phase of infection. The expected result is Negative. Fact Sheet for Patients:  StrictlyIdeas.no Fact Sheet for Healthcare Providers: BankingDealers.co.za This test is not yet approved or cleared by the [place] FDA and has been authorized for detection and/or diagnosis of SARS-CoV-2 by FDA under an Emergency Use Authorization (EUA).  This EUA will remain in effect (meaning this test can be used) for the duration of the COVID-19 declaration under Section 564(b)(1) of the Act, 21 U.S.C. section 360bbb-3(b)(1), unless the authorization is terminated or revoked sooner. Performed at [facility], [address], [place],  [address]    Dg Chest 2 View  Result Date:  09/16/2018 CLINICAL DATA:  Palpitations. EXAM: CHEST - 2 VIEW COMPARISON:  None. FINDINGS: The cardiomediastinal contours are normal. Mild aortic tortuosity the lungs are clear. Pulmonary vasculature is normal. No consolidation, pleural effusion, or pneumothorax. No acute osseous abnormalities are seen. IMPRESSION: No acute chest findings. Electronically Signed   By: Narda RutherfordMelanie  Sanford M.D.   On: 09/16/2018 01:28    Pending Labs Unresulted Labs (From admission, onward)   None      Vitals/Pain Today's Vitals   09/16/18 0115 09/16/18 0136 09/16/18 0200 09/16/18 0215  BP: (!) 152/91  (!) 145/73 128/69  Pulse: (!) 48  (!) 56 66  Resp: 16  16 19   Temp:      TempSrc:      SpO2: 99%  100% 100%  Weight:  71.2 kg    Height:  5\' 5"  (1.651 m)    PainSc:        Isolation Precautions No active isolations  Medications Medications  sodium chloride flush (NS) 0.9 % injection 3 mL (has no administration in time range)  nitroGLYCERIN (NITROGLYN) 2 % ointment 0.5 inch (0.5 inches Topical Given 09/16/18 0133)    Mobility manual wheelchair Low fall risk   Focused Assessments Cardiac  Assessment Handoff:    No results found for: CKTOTAL, CKMB, CKMBINDEX, TROPONINI No results found for: DDIMER Does the Patient currently have chest pain? No      R Recommendations: See Admitting Provider Note  Report given to:   Additional Notes: None

## 2018-09-16 NOTE — Discharge Summary (Signed)
[place] [facility] [place] NAME: Ashley Combs    MR#:  [id number]  DATE OF BIRTH:  Oct 31, 1940  DATE OF ADMISSION:  09/16/2018   ADMITTING PHYSICIAN: Harrie Foreman, MD  DATE OF DISCHARGE: 09/16/2018  7:11 PM  PRIMARY CARE PHYSICIAN: System, Pcp Not In   ADMISSION DIAGNOSIS:  Heart palpitations [R00.2] Chest pain, unspecified type [R07.9] DISCHARGE DIAGNOSIS:  Active Problems:   Palpitations  SECONDARY DIAGNOSIS:   Past Medical History:  Diagnosis Date  . Anxiety   . Hypothyroidism   . Palpitations    HOSPITAL COURSE:  History of presenting illness: Ashley Combs is a 78 yo female with past medical history of hypothyroidism and palpitations presented to the ED complaining of racing heartbeat.  At that time she also began to feel palpitations as well as some numbness in her left arm. Initial high-sensitivity troponins in the ED were negative x2.  However the patient continued to have ectopy on telemetry.  Her blood pressure was also found to be greater than 170/80.  Nitropaste was applied to her chest in the ED and hospitalist service was called for admission.   Hospital course: 1. Palpitations- Previous Holter monitor evaluation in [place] did not show significant arrhythmia. States she has an appt with EP 10/04/2018. Endorses worsening with exertion. ECHO here was normal. Cardiology recommended low-dose beta-blocker with slow titration upwards. She was started on metoprolol succinate 12.5 mg daily, and if she continues to have symptoms would increase the dose up to 25 mg daily. For breakthrough episodes it was  suggested she take propranolol 10 mg as needed, 3 times daily PRN. This dose could also be increased up to 20 mg as needed if needed.   2. Atypical chest pain, resolved shortly after admission. Patient with hx of anxiety. No evidence of myocardial ischemia. High sensitivity troponin negative x2.   3. Hypertension. Patient not on any  home medications, and BP improved with nitropaste. Consider ACE/ARB or calcium channel blocker if needed.  4. Hypothyroidism, TSH wnl at 2.707, however patient is on biotin daily at home which may interfere with TSH levels, may need recheck after being off biotin if concerned thyroid may be causing palpitations. Home Synthroid was continued.   5. Anxiety/Depression: home medications remeron, atarax and wellbutrin continued during stay.   DISCHARGE CONDITIONS:  stable CONSULTS OBTAINED:  Treatment Team:  Ashley Merritts, MD DRUG ALLERGIES:   Allergies  Allergen Reactions  . Aspirin    DISCHARGE MEDICATIONS:   STARTED ON:  metoprolol succinate 12.5 mg daily  propranolol 10 mg as needed, 3 times daily PRN palpitations  Allergies as of 09/16/2018      Reactions   Aspirin       Medication List    TAKE these medications   Biotin 10000 MCG Tabs Take 1 tablet by mouth daily.   buPROPion 100 MG tablet Commonly known as: WELLBUTRIN Take 100 mg by mouth daily.   cholecalciferol 25 MCG (1000 UT) tablet Commonly known as: VITAMIN D3 Take 1,000 Units by mouth daily.   co-enzyme Q-10 30 MG capsule Take 30 mg by mouth daily.   estradiol 0.5 MG tablet Commonly known as: ESTRACE Take 0.5 mg by mouth daily.   hydrOXYzine 25 MG tablet Commonly known as: ATARAX/VISTARIL Take 25 mg by mouth 3 (three) times daily as needed.   levothyroxine 175 MCG tablet Commonly known as: SYNTHROID Take 175 mcg by mouth daily before breakfast.   Linzess 290 MCG  Caps capsule Generic drug: linaclotide Take 290 mcg by mouth daily before breakfast.   mirtazapine 15 MG tablet Commonly known as: REMERON Take 15 mg by mouth at bedtime.   multivitamin with minerals Tabs tablet Take 1 tablet by mouth daily.   pantoprazole 40 MG tablet Commonly known as: PROTONIX Take 40 mg by mouth daily.   vitamin C 1000 MG tablet Take 1,000 mg by mouth daily.        DISCHARGE INSTRUCTIONS:    DIET:  Regular diet DISCHARGE CONDITION:  Good ACTIVITY:  Activity as tolerated OXYGEN:  Home Oxygen: No.  Oxygen Delivery: n/a DISCHARGE LOCATION:  home   If you experience worsening of your admission symptoms, develop shortness of breath, life threatening emergency, suicidal or homicidal thoughts you must seek medical attention immediately by calling 911 or calling your MD immediately  if symptoms less severe.  You Must read complete instructions/literature along with all the possible adverse reactions/side effects for all the Medicines you take and that have been prescribed to you. Take any new Medicines after you have completely understood and accpet all the possible adverse reactions/side effects.   Please note  You were cared for by a hospitalist during your hospital stay. If you have any questions about your discharge medications or the care you received while you were in the hospital after you are discharged, you can call the unit and asked to speak with the hospitalist on call if the hospitalist that took care of you is not available. Once you are discharged, your primary care physician will handle any further medical issues. Please note that NO REFILLS for any discharge medications will be authorized once you are discharged, as it is imperative that you return to your primary care physician (or establish a relationship with a primary care physician if you do not have one) for your aftercare needs so that they can reassess your need for medications and monitor your lab values.    On the day of Discharge:  VITAL SIGNS:  Blood pressure (!) 154/95, pulse 64, temperature 98.4 F (36.9 C), temperature source Oral, resp. rate 20, height 5\' 5"  (1.651 m), weight 69.9 kg, SpO2 98 %. PHYSICAL EXAMINATION:  GENERAL:  78 y.o.-year-old patient lying in the bed with no acute distress.  EYES: Pupils equal, round, reactive to light and accommodation. No scleral icterus. Extraocular muscles  intact.  HEENT: Head atraumatic, normocephalic. Oropharynx and nasopharynx clear.  NECK:  Supple, no jugular venous distention. No thyroid enlargement, no tenderness.  LUNGS: Normal breath sounds bilaterally, no wheezing, rales,rhonchi or crepitation. No use of accessory muscles of respiration.  CARDIOVASCULAR: S1, S2 normal. No murmurs, rubs, or gallops.  ABDOMEN: Soft, non-tender, non-distended. Bowel sounds present. No organomegaly or mass.  EXTREMITIES: No pedal edema, cyanosis, or clubbing.  NEUROLOGIC: Cranial nerves II through XII are intact. Muscle strength 5/5 in all extremities. Sensation intact. Gait not checked.  PSYCHIATRIC: The patient is alert and oriented x 3.  SKIN: No obvious rash, lesion, or ulcer.  DATA REVIEW:   CBC Recent Labs  Lab 09/15/18 2218  WBC 7.3  HGB 12.3  HCT 37.3  PLT 194    Chemistries  Recent Labs  Lab 09/15/18 2218  NA 136  K 4.3  CL 103  CO2 25  GLUCOSE 92  BUN 18  CREATININE 0.93  CALCIUM 9.6     Microbiology Results   09/16/2018: SARS Coronavirus 2 NEGATIVE    RADIOLOGY:  No results found.   Management plans discussed  with the patient, family and they are in agreement.  CODE STATUS: Prior   [place] Lalani Winkles, DO PGY-3, [facility]  09/17/2018 at 8:55 AM  Between 7am to 6pm - Pager - [phone]  After 6pm go to [email or web address] - Social research officer, governmentpassword EPAS [facility]  [facility]  Office  [phone]  CC: Primary care physician; System, Pcp Not In   Note: This dictation was prepared with Dragon dictation along with smaller phrase technology. Any transcriptional errors that result from this process are unintentional.

## 2018-09-17 ENCOUNTER — Other Ambulatory Visit: Payer: Self-pay | Admitting: Family Medicine

## 2018-09-17 MED ORDER — PROPRANOLOL HCL 10 MG PO TABS: 10.0000 mg | ORAL_TABLET | Freq: Three times a day (TID) | ORAL | 0 refills | Status: AC | PRN

## 2018-09-17 MED ORDER — METOPROLOL SUCCINATE ER 25 MG PO TB24: 12.5000 mg | ORAL_TABLET | Freq: Every day | ORAL | 0 refills | Status: DC

## 2018-09-20 ENCOUNTER — Telehealth: Payer: Self-pay | Admitting: Cardiovascular Disease

## 2018-09-20 NOTE — Telephone Encounter (Signed)
Spoke with the pt. Pt was seen in the ED by Dr.Gollan. Pt sts that yesterday her BP and HR were elevated 143/100 95bpm. She contact the on call physician and was told to double her dose of Metoprolol to 1 tab 25mg . Pt also toll the prn propanolol.  Pt bp this morning 131/81 49bpm. She has taken just the 1/2 tab of metoprolol this morning. Pt is asymptomatic. She is visiting from [facility] and will return home on 09/28/18. Pt sts that she will need an updated prescription since she will run out. She has no f/u with [facility]. Adv her that I will fwd the update to Dr.Gollan

## 2018-09-20 NOTE — Telephone Encounter (Signed)
Patient calling in after seeing Dr. Rockey Situ in the hospital on 7/9 and speaking to the on call physician yesterday. Dr. Brett Albino prescribed her Metroprolol 25 mg and to take half, the on call cardio physician told her to double up yesterday. Patient calling now because she was instructed to call in and let Dr. Rockey Situ know, might possibly need a larger dosage prescription.    Please advise

## 2018-09-20 NOTE — Telephone Encounter (Signed)
Patient calling to check status of advise from Dr. Rockey Situ

## 2018-09-21 ENCOUNTER — Ambulatory Visit (INDEPENDENT_AMBULATORY_CARE_PROVIDER_SITE_OTHER): Payer: Medicare HMO | Admitting: Cardiovascular Disease

## 2018-09-21 ENCOUNTER — Other Ambulatory Visit: Payer: Self-pay

## 2018-09-21 ENCOUNTER — Encounter: Payer: Self-pay | Admitting: Cardiovascular Disease

## 2018-09-21 VITALS — BP 135/79 | HR 54 | Ht 65.0 in | Wt 153.0 lb

## 2018-09-21 DIAGNOSIS — R002 Palpitations: Secondary | ICD-10-CM | POA: Diagnosis not present

## 2018-09-21 MED ORDER — LOSARTAN POTASSIUM 50 MG PO TABS: 50.0000 mg | ORAL_TABLET | Freq: Every day | ORAL | 3 refills | Status: DC

## 2018-09-21 MED ORDER — LOSARTAN POTASSIUM 50 MG PO TABS: 50.0000 mg | ORAL_TABLET | Freq: Every day | ORAL | 3 refills | Status: AC

## 2018-09-21 NOTE — Patient Instructions (Addendum)
Medication Instructions:   Your physician has recommended you make the following change in your medication:  1. STOP Metoprolol 2. START Losartan 50 mg once a day (ok to start on a 1/2 daily) 3. CONTINUE Propranolol as needed for palpitations  If you need a refill on your cardiac medications before your next appointment, please call your pharmacy.    Lab work: No new labs needed   If you have labs (blood work) drawn today and your tests are completely normal, you will receive your results only by: Marland Kitchen. MyChart Message (if you have MyChart) OR . A paper copy in the mail If you have any lab test that is abnormal or we need to change your treatment, we will call you to review the results.   Testing/Procedures: No new testing needed   Follow-Up: At [facility], you and your health needs are our priority.  As part of our continuing mission to provide you with exceptional heart care, we have created designated Provider Care Teams.  These Care Teams include your primary Cardiologist (physician) and Advanced Practice Providers (APPs -  Physician Assistants and Nurse Practitioners) who all work together to provide you with the care you need, when you need it.  . You will need a follow up appointment as needed  . Providers on your designated Care Team:   . Nicolasa Duckinghristopher Berge, NP . Eula Listenyan Dunn, PA-C . Marisue IvanJacquelyn Visser, PA-C  Any Other Special Instructions Will Be Listed Below (If Applicable).  For educational health videos Log in to : [email or web address].com Or : [email or web address], password : [email or web address]   How to Take Your Blood Pressure You can take your blood pressure at home with a machine. You may need to check your blood pressure at home:  To check if you have high blood pressure (hypertension).  To check your blood pressure over time.  To make sure your blood pressure medicine is working. Supplies needed: You will need a blood pressure machine, or monitor. You can buy one at a drugstore or  online. When choosing one:  Choose one with an arm cuff.  Choose one that wraps around your upper arm. Only one finger should fit between your arm and the cuff.  Do not choose one that measures your blood pressure from your wrist or finger. Your doctor can suggest a monitor. How to prepare Avoid these things for 30 minutes before checking your blood pressure:  Drinking caffeine.  Drinking alcohol.  Eating.  Smoking.  Exercising. Five minutes before checking your blood pressure:  Pee.  Sit in a dining chair. Avoid sitting in a soft couch or armchair.  Be quiet. Do not talk. How to take your blood pressure Follow the instructions that came with your machine. If you have a digital blood pressure monitor, these may be the instructions: 1. Sit up straight. 2. Place your feet on the floor. Do not cross your ankles or legs. 3. Rest your left arm at the level of your heart. You may rest it on a table, desk, or chair. 4. Pull up your shirt sleeve. 5. Wrap the blood pressure cuff around the upper part of your left arm. The cuff should be 1 inch (2.5 cm) above your elbow. It is best to wrap the cuff around bare skin. 6. Fit the cuff snugly around your arm. You should be able to place only one finger between the cuff and your arm. 7. Put the cord inside the groove of your elbow. 8. Press the power button. 9.  Sit quietly while the cuff fills with air and loses air. 10. Write down the numbers on the screen. 11. Wait 2-3 minutes and then repeat steps 1-10. What do the numbers mean? Two numbers make up your blood pressure. The first number is called systolic pressure. The second is called diastolic pressure. An example of a blood pressure reading is "120 over 80" (or 120/80). If you are an adult and do not have a medical condition, use this guide to find out if your blood pressure is normal: Normal  First number: below 120.  Second number: below 80. Elevated  First number:  120-129.  Second number: below 80. Hypertension stage 1  First number: 130-139.  Second number: 80-89. Hypertension stage 2  First number: 140 or above.  Second number: 14 or above. Your blood pressure is above normal even if only the top or bottom number is above normal. Follow these instructions at home:  Check your blood pressure as often as your doctor tells you to.  Take your monitor to your next doctor's appointment. Your doctor will: ? Make sure you are using it correctly. ? Make sure it is working right.  Make sure you understand what your blood pressure numbers should be.  Tell your doctor if your medicines are causing side effects. Contact a doctor if:  Your blood pressure keeps being high. Get help right away if:  Your first blood pressure number is higher than 180.  Your second blood pressure number is higher than 120. This information is not intended to replace advice given to you by your health care provider. Make sure you discuss any questions you have with your health care provider. Document Released: 02/07/2008 Document Revised: 02/06/2017 Document Reviewed: 08/03/2015 Elsevier Patient Education  2020 [employer].

## 2018-09-21 NOTE — Progress Notes (Signed)
Cardiology Office Note  Date:  09/21/2018   ID:  Ashley MantleJoyce Mcbryar, DOB 09-Nov-1940, MRN [id number]  PCP:  System, Pcp Not In   Chief Complaint  Patient presents with  . Other    Patient c/o BP issues and HR running low. Meds reviewed verbally with patient.     HPI:   Ashley Combs is a 78 y.o. female with a hx of  smoking, COPD,  Anxiety Recently seen in the hospital for hypertension, palpitations She was started on metoprolol succinate 12.5 daily with propranolol 10 mg as needed for breakthrough palpitations  Lives in [place], visiting [place] to stay with a friend On admission to the hospital she reported having some elevated blood pressure at home  Diastolic pressures into the 90s Also having some episodic palpitations difficult to quantify frequency or duration, Feels like a butterfly in the chest Denies any near-syncope or syncope   shortness of breath on exertion Able to walk 15 minutes then has to stop to catch her breath before she is able to resume her walking  She did report having a stress test several weeks ago, was told it looked normal  Also reported having a 1 week monitor while in [place], told that she had irregular heartbeat She was referred to EP, scheduled to see EP in the next several weeks  In follow-up today after hospital discharge reports that her blood pressure continues to run a little bit high,  She called the emergency line and was told to increase metoprolol succinate up to 25 daily Since then she has been missing doses of the metoprolol succinate Was worried about low heart rate high 40s low 50s on the metoprolol succinate Blood pressure still high  Overall feels fine, active, plans on going back to [place] in the next 2 weeks  EKG personally reviewed by myself on todays visit Shows sinus bradycardia rate 54 bpm rare PAC left axis deviation  PMH:   has a past medical history of Anxiety, Hypothyroidism, and Palpitations.  PSH:    Past  Surgical History:  Procedure Laterality Date  . BACK SURGERY    . HYSTERECTOMY ABDOMINAL WITH SALPINGECTOMY      Current Outpatient Medications  Medication Sig Dispense Refill  . Ascorbic Acid (VITAMIN C) 1000 MG tablet Take 1,000 mg by mouth daily.    . Biotin [id number] MCG TABS Take 1 tablet by mouth daily.    Marland Kitchen. buPROPion (WELLBUTRIN) 100 MG tablet Take 100 mg by mouth daily.    . cholecalciferol (VITAMIN D3) 25 MCG (1000 UT) tablet Take 1,000 Units by mouth daily.    Marland Kitchen. co-enzyme Q-10 30 MG capsule Take 30 mg by mouth daily.    Marland Kitchen. estradiol (ESTRACE) 0.5 MG tablet Take 0.5 mg by mouth daily.    . hydrOXYzine (ATARAX/VISTARIL) 25 MG tablet Take 25 mg by mouth 3 (three) times daily as needed.    Marland Kitchen. levothyroxine (SYNTHROID) 175 MCG tablet Take 175 mcg by mouth daily before breakfast.    . linaclotide (LINZESS) 290 MCG CAPS capsule Take 290 mcg by mouth daily before breakfast.    . mirtazapine (REMERON) 15 MG tablet Take 15 mg by mouth at bedtime.    . Multiple Vitamin (MULTIVITAMIN WITH MINERALS) TABS tablet Take 1 tablet by mouth daily.    . pantoprazole (PROTONIX) 40 MG tablet Take 40 mg by mouth daily.    . propranolol (INDERAL) 10 MG tablet Take 1 tablet (10 mg total) by mouth 3 (three) times daily as needed (  palpitations). 30 tablet 0  . losartan (COZAAR) 50 MG tablet Take 1 tablet (50 mg total) by mouth daily. 90 tablet 3   No current facility-administered medications for this visit.      Allergies:   Aspirin   Social History:  The patient  reports that she has never smoked. She has never used smokeless tobacco. She reports previous alcohol use. She reports previous drug use.   Family History:   family history includes CAD in her brother; Cancer in her father and mother.    Review of Systems: Review of Systems  Constitutional: Negative.   HENT: Negative.   Respiratory: Negative.   Cardiovascular: Negative.   Gastrointestinal: Negative.   Musculoskeletal: Negative.    Neurological: Negative.   Psychiatric/Behavioral: Negative.   All other systems reviewed and are negative.   PHYSICAL EXAM: VS:  BP 135/79 (BP Location: Left Arm, Patient Position: Sitting, Cuff Size: Normal)   Pulse (!) 54   Ht 5\' 5"  (1.651 m)   Wt 153 lb (69.4 kg)   BMI 25.46 kg/m  , BMI Body mass index is 25.46 kg/m. GEN: Well nourished, well developed, in no acute distress HEENT: normal Neck: no JVD, carotid bruits, or masses Cardiac: RRR; no murmurs, rubs, or gallops,no edema  Respiratory:  clear to auscultation bilaterally, normal work of breathing GI: soft, nontender, nondistended, + BS MS: no deformity or atrophy Skin: warm and dry, no rash Neuro:  Strength and sensation are intact Psych: euthymic mood, full affect   Recent Labs: 09/15/2018: BUN 18; Creatinine, Ser 0.93; Hemoglobin 12.3; Platelets 194; Potassium 4.3; Sodium 136 09/16/2018: TSH 2.707    Lipid Panel No results found for: CHOL, HDL, LDLCALC, TRIG    Wt Readings from Last 3 Encounters:  09/21/18 153 lb (69.4 kg)  09/16/18 154 lb 1.6 oz (69.9 kg)       ASSESSMENT AND PLAN:  Problem List Items Addressed This Visit    Palpitations - Primary   Relevant Orders   EKG 12-Lead     1. Palpitations/PACs 2. On telemetry in the hospital, one short run nonsustained VT 4 beats Suspect her palpitations symptoms are coming from the PACs She has taken herself off metoprolol succinate 25 daily, feels it is making her heart rate runs low -She is not particularly bothered by palpitations anymore Recommend she continue to take propranolol 10 mg  as needed for breakthrough palpitations, we will stop metoprolol  2 hypertension As she is off metoprolol we will start losartan, suggested she try 25 mg daily with slow titration up to 50 mg after 5 days for systolic pressure greater than 135  3.  Shortness of breath Prior smoking history/COPD Also deconditioning Recommend she start regular walking program recent  stress test 3 weeks ago No further ischemic work-up needed at this time Essentially normal echocardiogram while in the hospital  4.  Atypical chest pain Normal echocardiogram, Stress test 3 weeks ago, no further ischemic work-up at this time No further work-up at this time  5.  Hypothyroidism On Synthroid Reports that TSH was elevated on generic thyroid medication, changed to brand name Synthroid   Disposition:   F/U as needed, reports she is traveling back to [place]   Total encounter time more than 25 minutes  Greater than 50% was spent in counseling and coordination of care with the patient    Signed, Esmond Plants, M.D., Ph.D. [place], [place]

## 2018-09-21 NOTE — Telephone Encounter (Signed)

## 2018-09-21 NOTE — Telephone Encounter (Signed)
Patient scheduled to come in today to see provider to review her concerns.

## 2018-10-07 ENCOUNTER — Other Ambulatory Visit: Payer: Self-pay | Admitting: Internal Medicine

## 2018-11-02 NOTE — Telephone Encounter (Signed)
Patient calling  States that she has questions regarding billing information - for office visit and hospital visit 7/9 Patient had no insurance Patient wants to speak with nurse or Dr Rockey Situ, patient declined speaking with billing department  Please call to discuss [phone]

## 2020-09-05 IMAGING — CR CHEST - 2 VIEW
2 series · 2 of 2 positions shown · non-contrast
Comparison: None.

CLINICAL DATA: Palpitations.

EXAM:
CHEST - 2 VIEW

[chest pa]
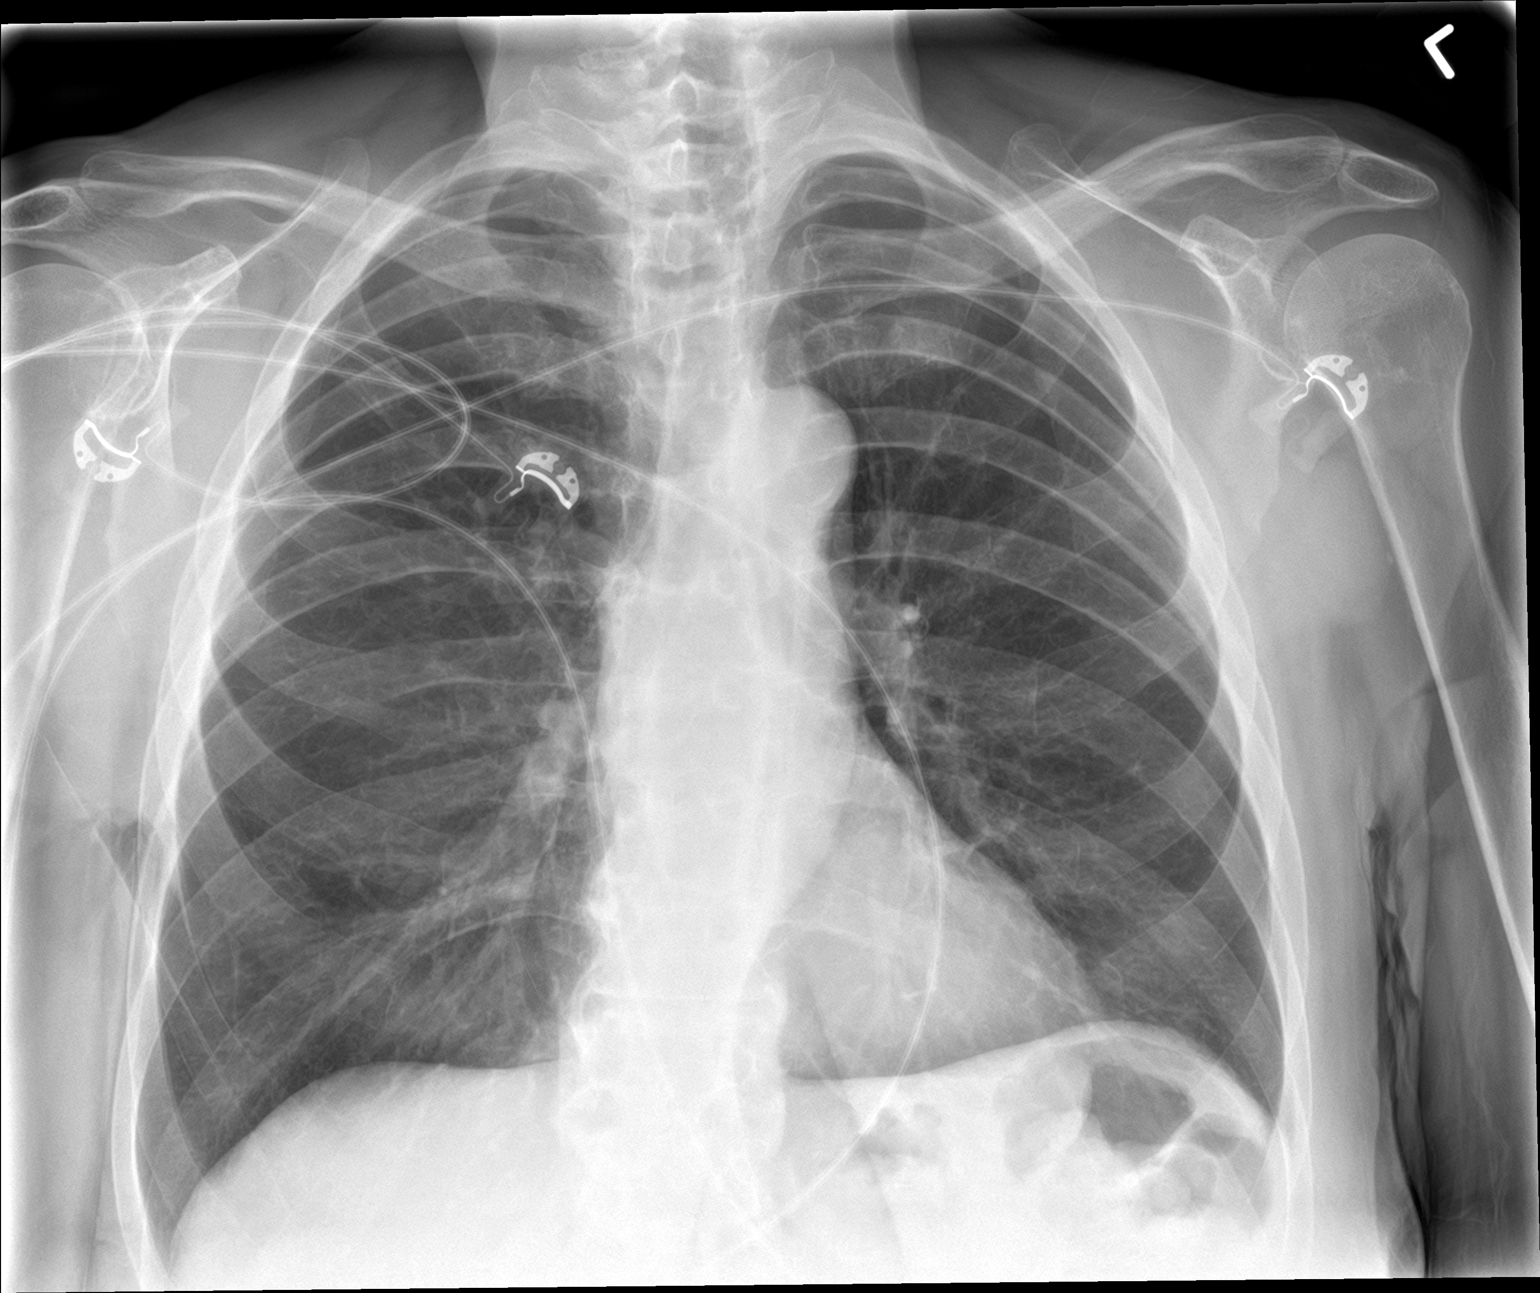

[chest lat]
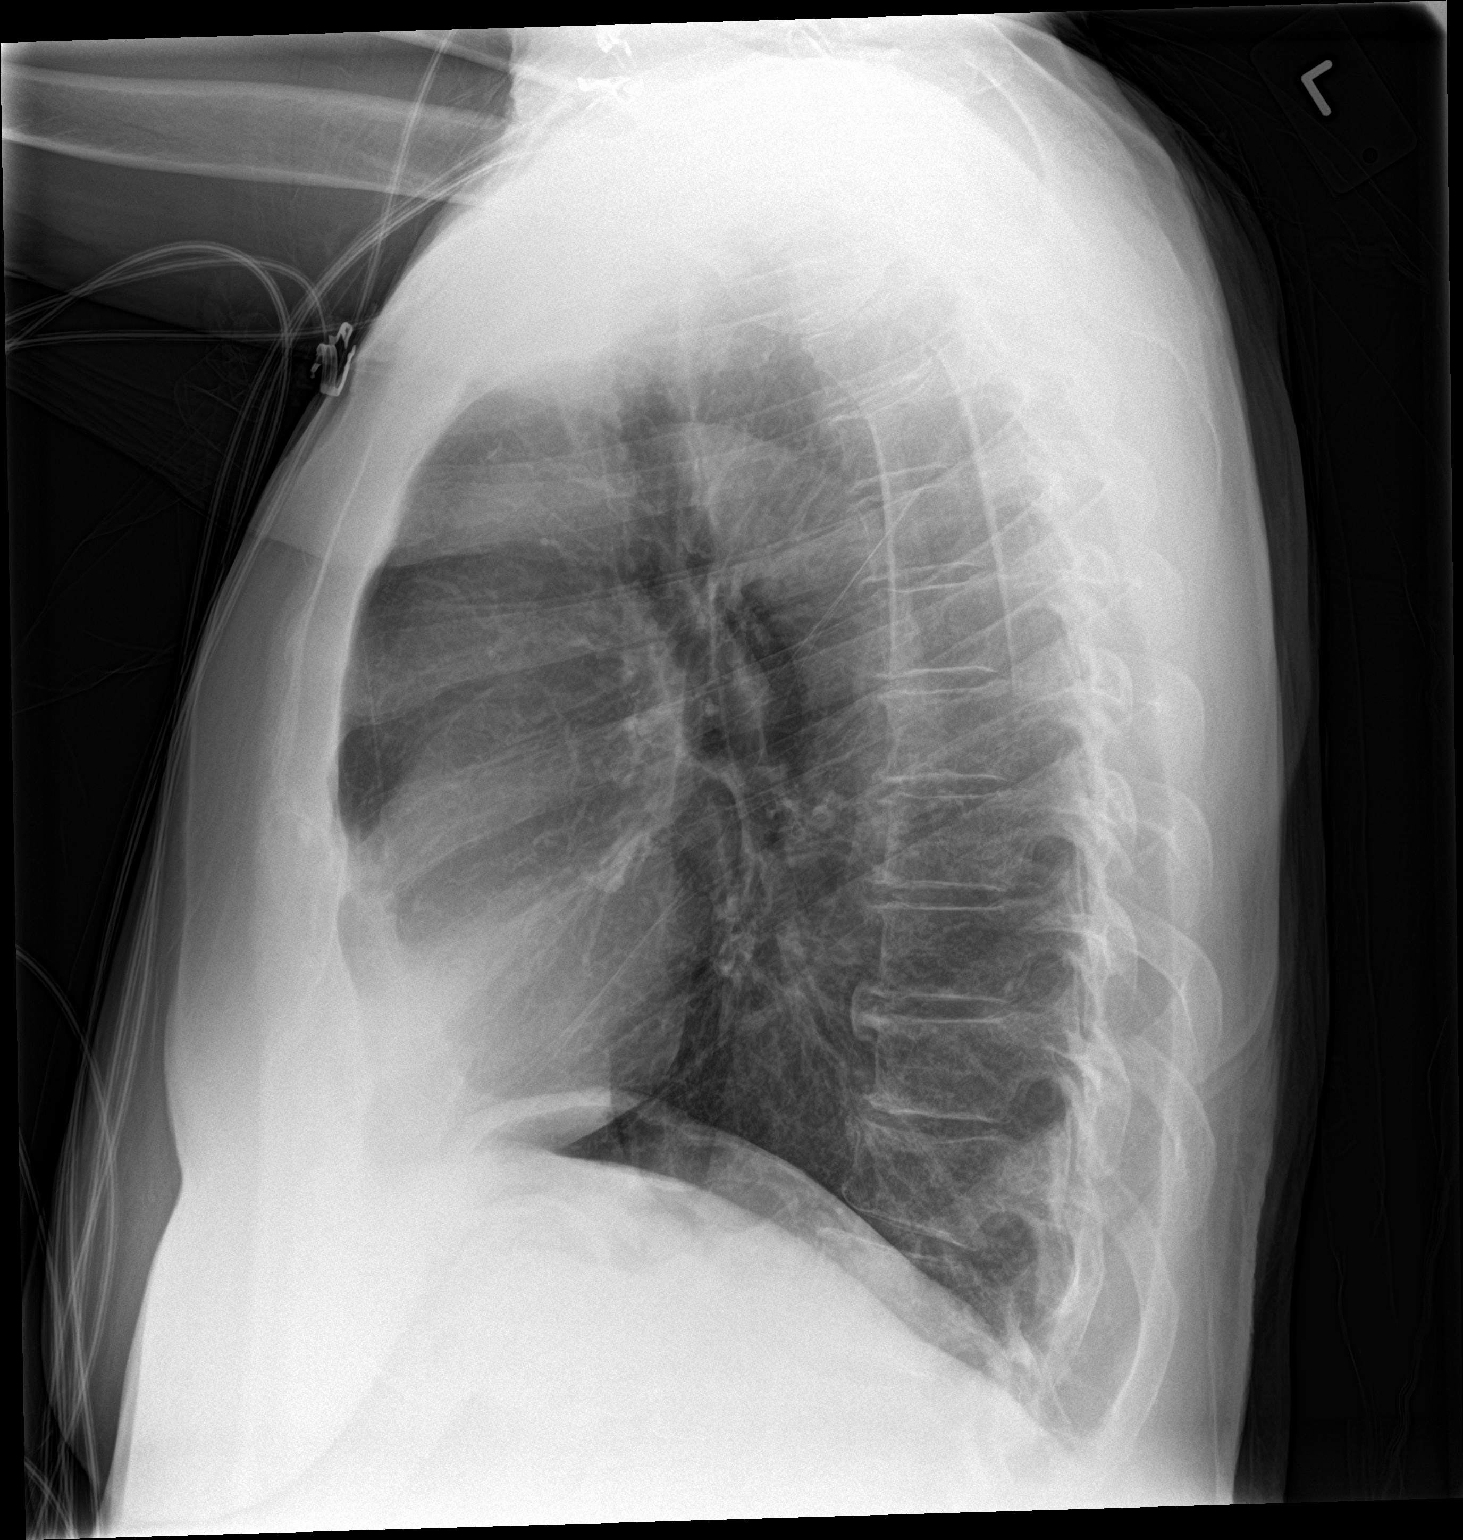

[2 of 2 positions shown; findings below may reference images not displayed]

FINDINGS: The cardiomediastinal contours are normal. Mild aortic tortuosity
the lungs are clear. Pulmonary vasculature is normal. No
consolidation, pleural effusion, or pneumothorax. No acute osseous
abnormalities are seen.
IMPRESSION: No acute chest findings.
